# Patient Record
Sex: Female | Born: 1955
Health system: Southern US, Community
[De-identification: ages and names within clinical notes are randomized; demographics above are authoritative.]

---

## 2004-10-06 ENCOUNTER — Ambulatory Visit (HOSPITAL_COMMUNITY): Admission: RE | Admit: 2004-10-06 | Discharge: 2004-10-06 | Payer: Self-pay | Admitting: Urology

## 2004-12-02 ENCOUNTER — Encounter (INDEPENDENT_AMBULATORY_CARE_PROVIDER_SITE_OTHER): Payer: Self-pay | Admitting: Specialist

## 2004-12-02 ENCOUNTER — Ambulatory Visit (HOSPITAL_COMMUNITY): Admission: RE | Admit: 2004-12-02 | Discharge: 2004-12-02 | Payer: Self-pay | Admitting: Surgery

## 2006-11-03 ENCOUNTER — Other Ambulatory Visit: Admission: RE | Admit: 2006-11-03 | Discharge: 2006-11-03 | Payer: Self-pay | Admitting: Family Medicine

## 2006-12-18 ENCOUNTER — Encounter: Admission: RE | Admit: 2006-12-18 | Discharge: 2006-12-18 | Payer: Self-pay | Admitting: *Deleted

## 2006-12-21 ENCOUNTER — Encounter (INDEPENDENT_AMBULATORY_CARE_PROVIDER_SITE_OTHER): Payer: Self-pay | Admitting: *Deleted

## 2006-12-21 ENCOUNTER — Encounter: Admission: RE | Admit: 2006-12-21 | Discharge: 2006-12-21 | Payer: Self-pay | Admitting: *Deleted

## 2007-12-01 ENCOUNTER — Encounter: Admission: RE | Admit: 2007-12-01 | Discharge: 2007-12-01 | Payer: Self-pay | Admitting: Family Medicine

## 2007-12-05 ENCOUNTER — Other Ambulatory Visit: Admission: RE | Admit: 2007-12-05 | Discharge: 2007-12-05 | Payer: Self-pay | Admitting: Family Medicine

## 2008-12-25 ENCOUNTER — Other Ambulatory Visit: Admission: RE | Admit: 2008-12-25 | Discharge: 2008-12-25 | Payer: Self-pay | Admitting: Family Medicine

## 2009-03-07 ENCOUNTER — Encounter: Admission: RE | Admit: 2009-03-07 | Discharge: 2009-03-07 | Payer: Self-pay | Admitting: Family Medicine

## 2010-05-01 ENCOUNTER — Encounter: Admission: RE | Admit: 2010-05-01 | Discharge: 2010-05-01 | Payer: Self-pay | Admitting: Family Medicine

## 2010-05-01 IMAGING — MG MM DIGITAL SCREENING
4 series · 4 of 4 positions shown · non-contrast
Comparison: none

DG SCREEN MAMMOGRAM BILATERAL
Bilateral CC and MLO view(s) were taken.

DIGITAL SCREENING MAMMOGRAM WITH CAD:
The breast tissue is heterogeneously dense.  No masses or malignant type calcifications are 
identified.  Compared with prior studies.
Images were processed with CAD.

[R CC]
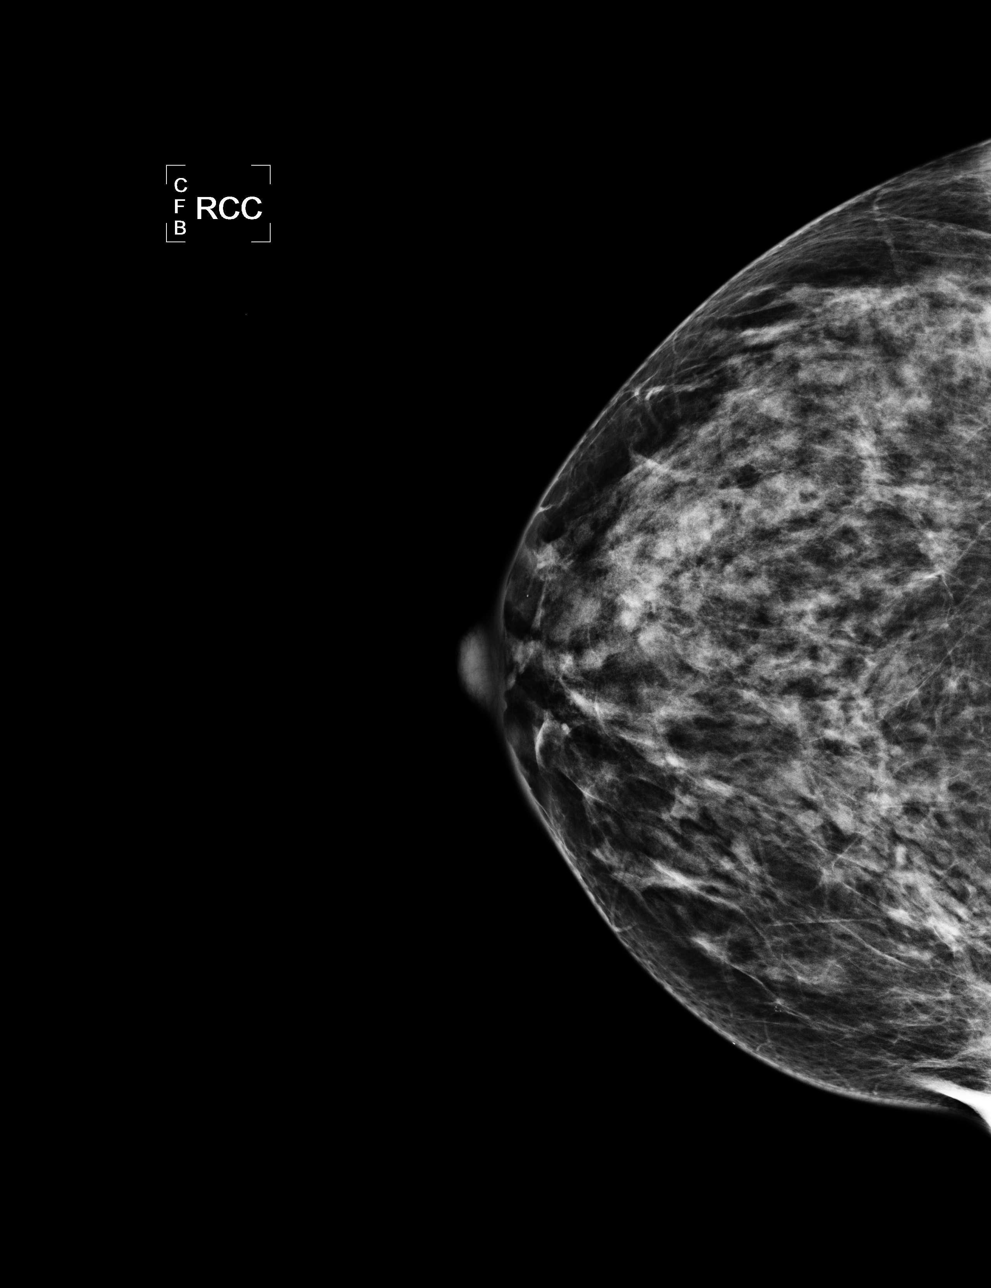

[L CC]
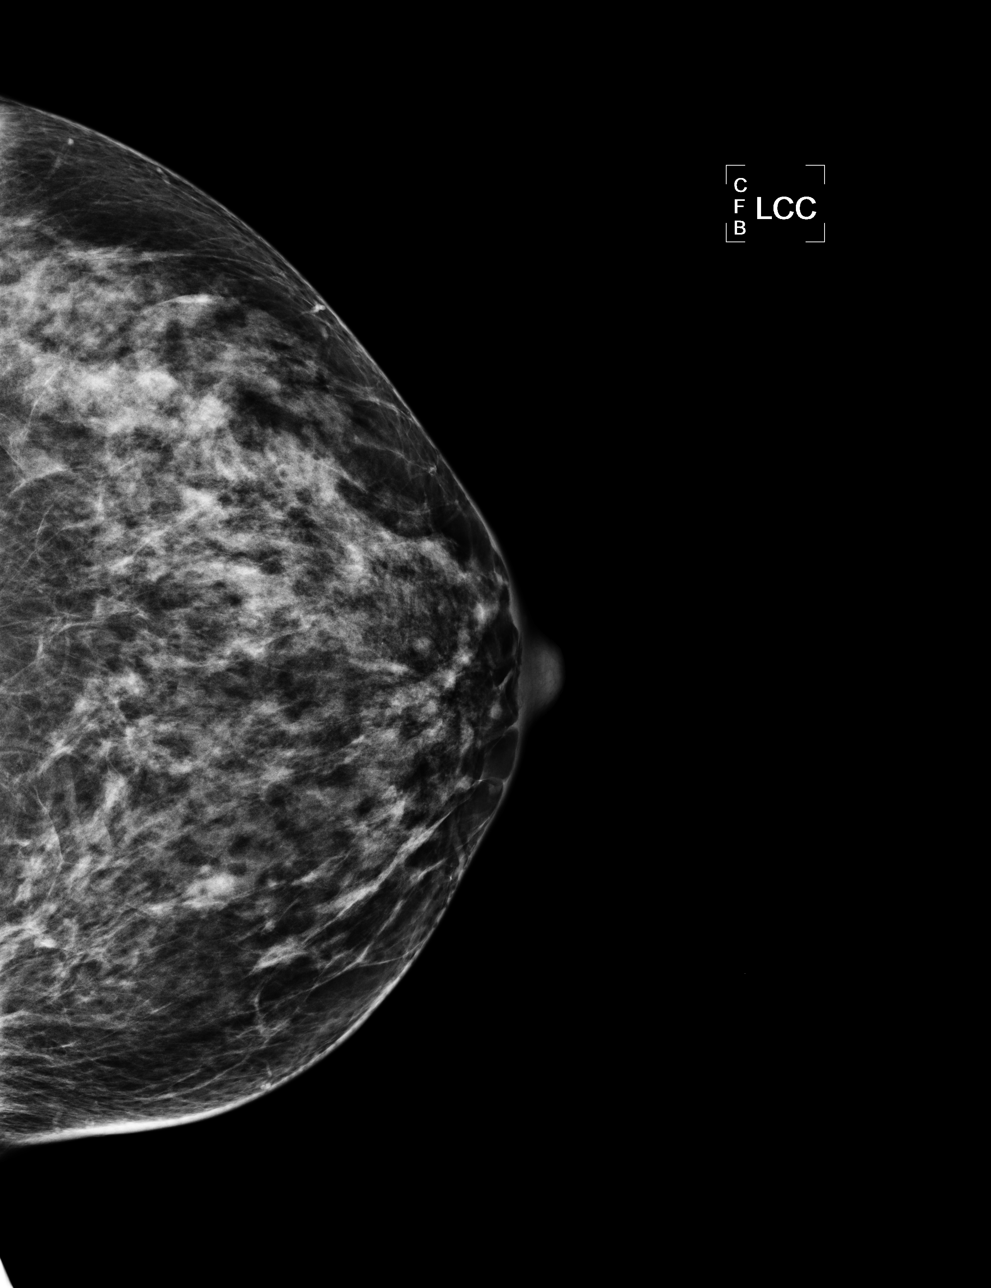

[L MLO]
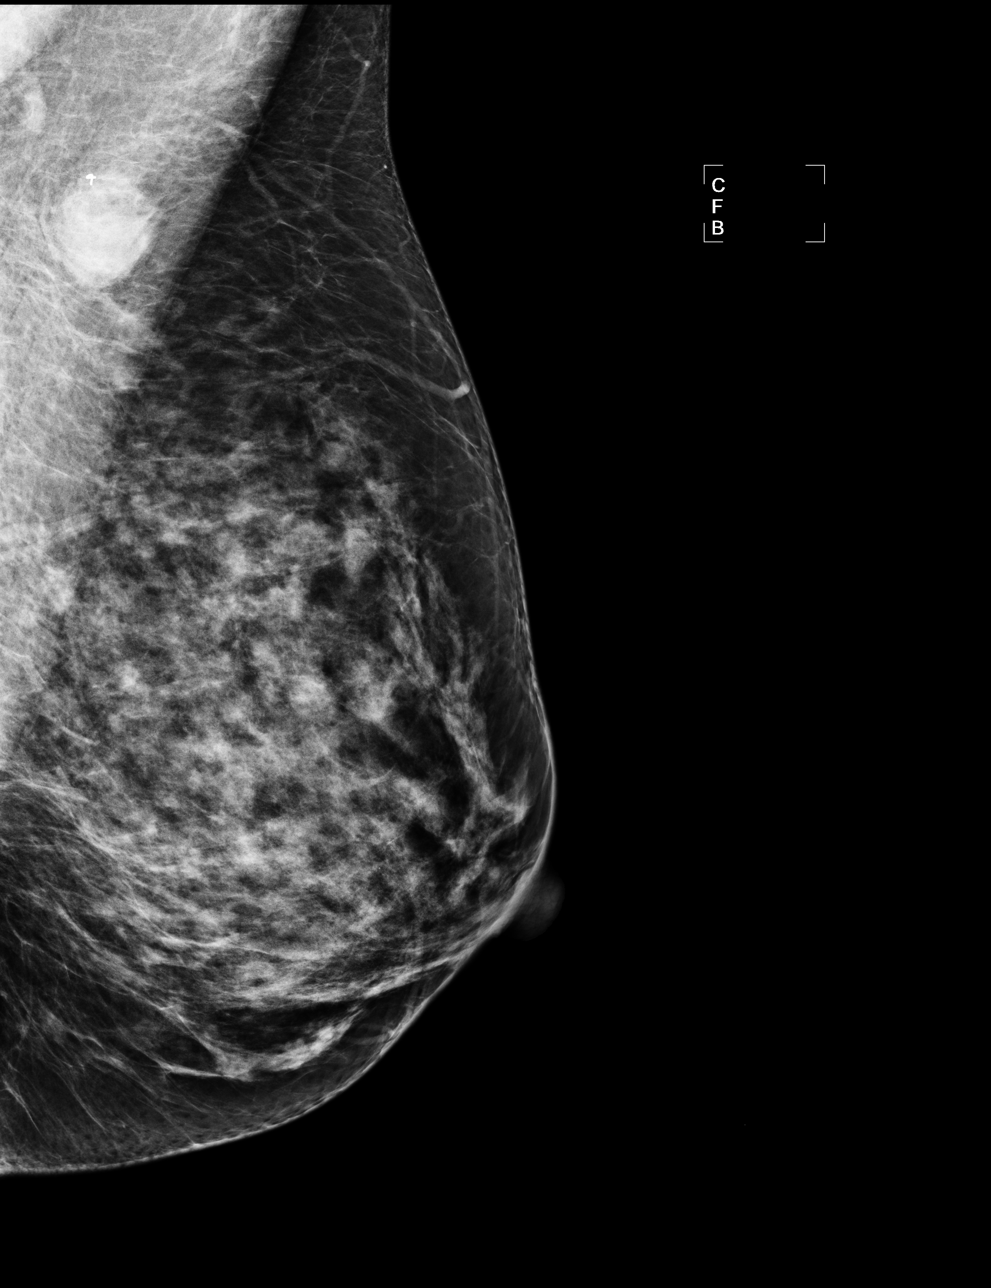

[R MLO]
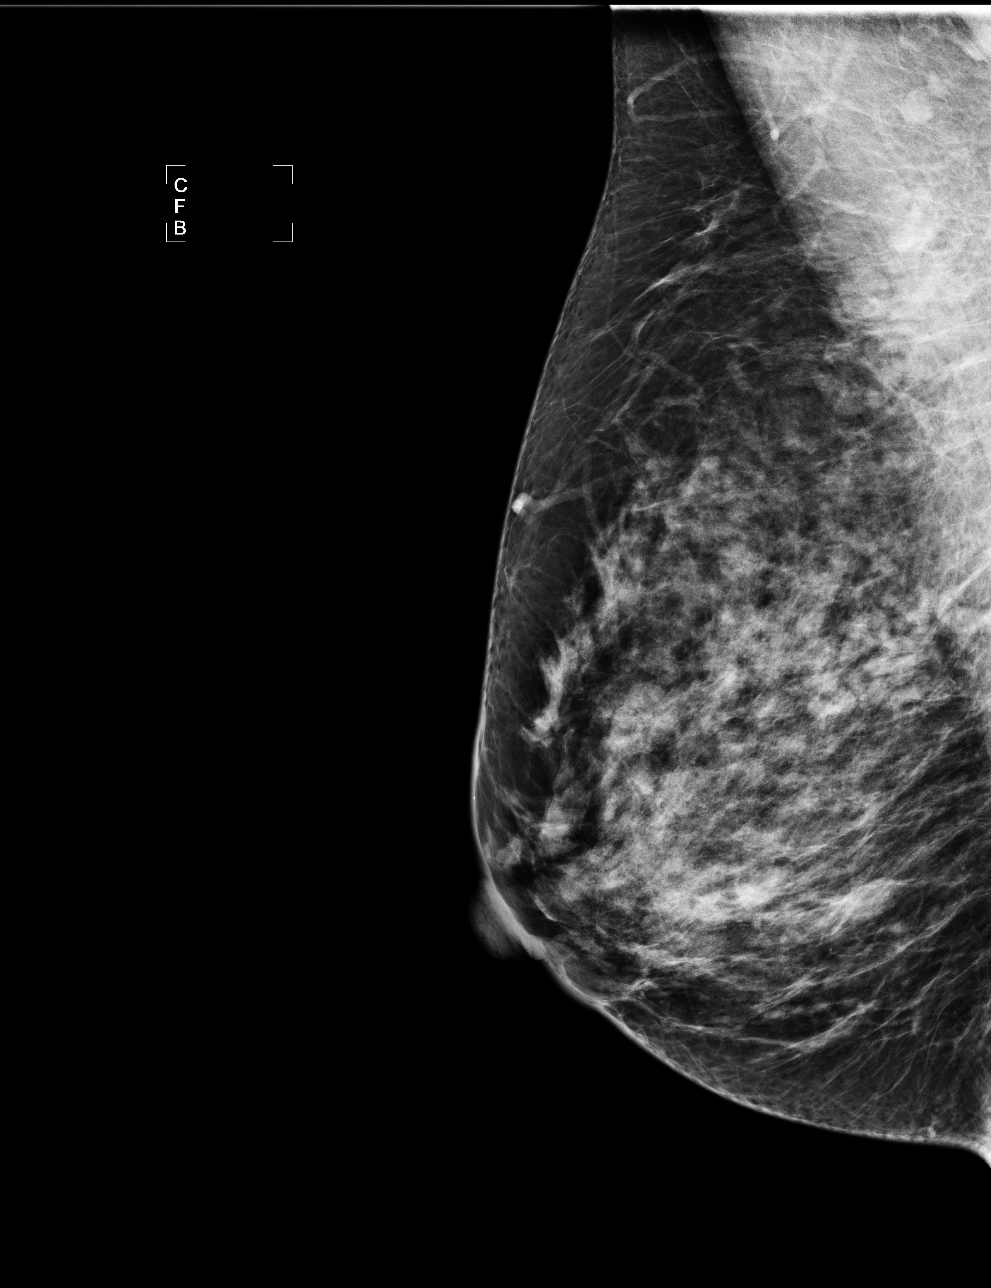

[4 of 4 positions shown; findings below may reference images not displayed]

IMPRESSION: No specific mammographic evidence of malignancy.  Next screening mammogram is recommended in one 
year.

A result letter of this screening mammogram will be mailed directly to the patient.

ASSESSMENT: Negative - BI-RADS 1

Screening mammogram in 1 year.
,

## 2011-04-09 NOTE — Op Note (Signed)
Tammie Mcdonald, Tammie Mcdonald             ACCOUNT NO.:  [id number]   MEDICAL RECORD NO.:  [id number]          PATIENT TYPE:  AMB   LOCATION:  DAY                          FACILITY:  [facility]   PHYSICIAN:  Currie Paris, M.D.DATE OF BIRTH:  1956/09/11   DATE OF PROCEDURE:  12/02/2004  DATE OF DISCHARGE:                                 OPERATIVE REPORT   OFFICE MEDICAL RECORD NUMBER:  [id number] [phone].   PREOPERATIVE DIAGNOSIS:  Biliary dyskinesia.   POSTOPERATIVE DIAGNOSIS:  Biliary dyskinesia.   OPERATIONS:  Laparoscopic cholecystectomy with operative cholangiogram.   SURGEON:  Currie Paris, M.D.   ASSISTANT:  Leonie Man, M.D.   ANESTHESIA:  General.   CLINICAL HISTORY:  This patient is a 55 year old with biliary-type symptoms  that are ongoing and a poorly functioning gallbladder with an ejection  fraction of about 10%.  After discussion with the patient, she elected to  proceed to cholecystectomy.   DESCRIPTION OF PROCEDURE:  The patient was seen in the holding area and had  no further questions.  We confirmed cholecystectomy was the planned  procedure.   The patient was taken to the operating room and after satisfactory general  endotracheal anesthesia had been obtained, the abdomen was prepped and  draped.  Plain Marcaine 0.25% was used for each incision.  The umbilical  incision was made, the fascia opened and the peritoneal cavity entered under  direct vision.  A pursestring was placed, Hassan introduced and the abdomen  insufflated to 15.   The patient was placed in reverse Trendelenburg and a 10-11 trocar placed in  the epigastrium and two 5s laterally.  The intra-abdominal contents appeared  fairly normal.  We did see some what appeared to a mesh plug in the right  angle area from a prior hernia repair.   The gallbladder appeared to be normal.  The gallbladder was retracted over  the liver and the peritoneum in the triangle of Calot on either side opened  so  that we could get a nice window and see the length of the cystic duct  with its junction with the gallbladder and the common duct.  I could also  see the area of the cystic artery, but did not dissect that out initially  any further than to make a big window back there to be sure that our anatomy  was correct.   At this point, the cystic duct was opened at the nearest junction with the  gallbladder.  A Cook catheter was placed percutaneously and threaded into  the cystic duct and held with a clip.  Operative cholangiography showed  normal hepatic ducts, normal common duct, good filling of the duodenum and  no filling defects.   The cystic duct catheter was removed.  Two clips were placed on the stay  side of the cystic duct and it was divided.  The artery was dissected out a  little further and clipped and divided with a little of the adventitial  tissue also clipped.  As we were coming up along the gallbladder with the  cautery, another posterolateral vessel was identified  and clipped.   The gallbladder was removed and held.  The area was irrigated and we checked  for hemostasis.  Everything appeared to be dry.   The gallbladder was brought the umbilical port.  We reinsufflated and made a  final hemostasis check.  Again, everything was dry.  The irrigant was  suctioned out.  The lateral ports were removed and they were dry.  The  umbilical port was closed with the pursestring.  The abdomen was desufflated  through the epigastric port.  This was also closed with a 0Vicryl suture in  the fascia.  The skin was closed with 4-0 Monocryl subcuticular and  Dermabond.   The patient tolerated the procedure well.  There were no operative  complications.  All counts were correct.     Chri   CJS/MEDQ  D:  12/02/2004  T:  12/02/2004  Job:  [id number]

## 2011-08-19 ENCOUNTER — Other Ambulatory Visit: Payer: Self-pay | Admitting: Family Medicine

## 2011-08-19 DIAGNOSIS — Z1231 Encounter for screening mammogram for malignant neoplasm of breast: Secondary | ICD-10-CM

## 2011-09-08 ENCOUNTER — Ambulatory Visit
Admission: RE | Admit: 2011-09-08 | Discharge: 2011-09-08 | Disposition: A | Discharge status: Home / Self Care | Payer: Self-pay | Source: Ambulatory Visit | Attending: Family Medicine | Admitting: Family Medicine

## 2011-09-08 DIAGNOSIS — Z1231 Encounter for screening mammogram for malignant neoplasm of breast: Secondary | ICD-10-CM

## 2015-04-22 ENCOUNTER — Other Ambulatory Visit: Payer: Self-pay

## 2015-04-22 DIAGNOSIS — Z1231 Encounter for screening mammogram for malignant neoplasm of breast: Secondary | ICD-10-CM

## 2015-04-25 ENCOUNTER — Ambulatory Visit
Admission: RE | Admit: 2015-04-25 | Discharge: 2015-04-25 | Disposition: A | Discharge status: Home / Self Care | Payer: No Typology Code available for payment source | Source: Ambulatory Visit

## 2015-04-25 DIAGNOSIS — Z1231 Encounter for screening mammogram for malignant neoplasm of breast: Secondary | ICD-10-CM

## 2019-05-30 ENCOUNTER — Telehealth: Payer: Self-pay | Admitting: Family Medicine

## 2019-05-30 DIAGNOSIS — Z20822 Contact with and (suspected) exposure to covid-19: Secondary | ICD-10-CM

## 2019-05-30 NOTE — Telephone Encounter (Signed)
Tammie Mcdonald from Dr Rowan Blase  w/ [facility] called to get pt scheduled for a covid lab Pt has cough headache fever, and  exposed to a positive person.   Please call pt at:  [phone] or [phone]  Wants to go to [facility]

## 2019-05-31 NOTE — Addendum Note (Signed)
Addended by: Benson Setting L on: 05/31/2019 12:04 PM   Modules accepted: Orders

## 2019-05-31 NOTE — Telephone Encounter (Signed)
Attempted to reach pt. No answer. Left vm for pt to call back M-F 7am-7pm

## 2021-01-06 DIAGNOSIS — H35373 Puckering of macula, bilateral: Secondary | ICD-10-CM | POA: Diagnosis not present

## 2021-05-29 DIAGNOSIS — E782 Mixed hyperlipidemia: Secondary | ICD-10-CM | POA: Diagnosis not present

## 2021-05-29 DIAGNOSIS — Z0001 Encounter for general adult medical examination with abnormal findings: Secondary | ICD-10-CM | POA: Diagnosis not present

## 2021-05-29 DIAGNOSIS — Z6821 Body mass index (BMI) 21.0-21.9, adult: Secondary | ICD-10-CM | POA: Diagnosis not present

## 2021-05-29 DIAGNOSIS — Z1389 Encounter for screening for other disorder: Secondary | ICD-10-CM | POA: Diagnosis not present

## 2021-06-02 ENCOUNTER — Other Ambulatory Visit (HOSPITAL_COMMUNITY): Payer: Self-pay | Admitting: Family Medicine

## 2021-06-02 DIAGNOSIS — E2839 Other primary ovarian failure: Secondary | ICD-10-CM

## 2021-06-09 ENCOUNTER — Other Ambulatory Visit: Payer: Self-pay | Admitting: Family Medicine

## 2021-06-09 DIAGNOSIS — Z139 Encounter for screening, unspecified: Secondary | ICD-10-CM

## 2021-06-11 ENCOUNTER — Other Ambulatory Visit: Payer: Self-pay

## 2021-06-11 ENCOUNTER — Ambulatory Visit
Admission: RE | Admit: 2021-06-11 | Discharge: 2021-06-11 | Disposition: A | Discharge status: Home / Self Care | Payer: Medicare Other | Source: Ambulatory Visit | Attending: Family Medicine | Admitting: Family Medicine

## 2021-06-11 DIAGNOSIS — Z139 Encounter for screening, unspecified: Secondary | ICD-10-CM

## 2021-06-11 DIAGNOSIS — Z1231 Encounter for screening mammogram for malignant neoplasm of breast: Secondary | ICD-10-CM | POA: Diagnosis not present

## 2021-06-29 ENCOUNTER — Other Ambulatory Visit: Payer: Self-pay

## 2021-06-29 ENCOUNTER — Ambulatory Visit (HOSPITAL_COMMUNITY)
Admission: RE | Admit: 2021-06-29 | Discharge: 2021-06-29 | Disposition: A | Discharge status: Home / Self Care | Payer: Medicare Other | Source: Ambulatory Visit | Attending: Family Medicine | Admitting: Family Medicine

## 2021-06-29 DIAGNOSIS — E2839 Other primary ovarian failure: Secondary | ICD-10-CM | POA: Diagnosis present

## 2021-06-29 DIAGNOSIS — M8589 Other specified disorders of bone density and structure, multiple sites: Secondary | ICD-10-CM | POA: Diagnosis not present

## 2021-08-31 DIAGNOSIS — M545 Low back pain, unspecified: Secondary | ICD-10-CM | POA: Diagnosis not present

## 2021-09-07 DIAGNOSIS — M545 Low back pain, unspecified: Secondary | ICD-10-CM | POA: Diagnosis not present

## 2022-01-22 DIAGNOSIS — H35373 Puckering of macula, bilateral: Secondary | ICD-10-CM | POA: Diagnosis not present
